# Patient Record
Sex: Female | Born: 1965 | Race: White | Hispanic: No | Marital: Single | Smoking: Never smoker
Health system: Southern US, Community
[De-identification: ages and names within clinical notes are randomized; demographics above are authoritative.]

## PROBLEM LIST (undated history)

## (undated) DIAGNOSIS — F32A Depression, unspecified: Secondary | ICD-10-CM

## (undated) DIAGNOSIS — M797 Fibromyalgia: Secondary | ICD-10-CM

## (undated) DIAGNOSIS — B379 Candidiasis, unspecified: Secondary | ICD-10-CM

## (undated) DIAGNOSIS — F338 Other recurrent depressive disorders: Secondary | ICD-10-CM

## (undated) DIAGNOSIS — A0472 Enterocolitis due to Clostridium difficile, not specified as recurrent: Secondary | ICD-10-CM

## (undated) DIAGNOSIS — J40 Bronchitis, not specified as acute or chronic: Secondary | ICD-10-CM

## (undated) DIAGNOSIS — F329 Major depressive disorder, single episode, unspecified: Secondary | ICD-10-CM

## (undated) DIAGNOSIS — R5382 Chronic fatigue, unspecified: Secondary | ICD-10-CM

## (undated) HISTORY — DX: Fibromyalgia: M79.7

## (undated) HISTORY — DX: Chronic fatigue, unspecified: R53.82

---

## 2007-01-16 ENCOUNTER — Emergency Department (HOSPITAL_COMMUNITY): Admission: EM | Admit: 2007-01-16 | Discharge: 2007-01-16 | Payer: Self-pay | Admitting: Emergency Medicine

## 2011-10-25 ENCOUNTER — Encounter (HOSPITAL_BASED_OUTPATIENT_CLINIC_OR_DEPARTMENT_OTHER): Payer: Self-pay | Admitting: *Deleted

## 2011-10-25 ENCOUNTER — Emergency Department (HOSPITAL_BASED_OUTPATIENT_CLINIC_OR_DEPARTMENT_OTHER)
Admission: EM | Admit: 2011-10-25 | Discharge: 2011-10-25 | Discharge status: Against Medical Advice | Payer: BC Managed Care – PPO | Attending: Emergency Medicine | Admitting: Emergency Medicine

## 2011-10-25 DIAGNOSIS — R109 Unspecified abdominal pain: Secondary | ICD-10-CM | POA: Insufficient documentation

## 2011-10-25 DIAGNOSIS — M25519 Pain in unspecified shoulder: Secondary | ICD-10-CM | POA: Insufficient documentation

## 2011-10-25 HISTORY — DX: Depression, unspecified: F32.A

## 2011-10-25 HISTORY — DX: Candidiasis, unspecified: B37.9

## 2011-10-25 HISTORY — DX: Bronchitis, not specified as acute or chronic: J40

## 2011-10-25 HISTORY — DX: Other recurrent depressive disorders: F33.8

## 2011-10-25 HISTORY — DX: Enterocolitis due to Clostridium difficile, not specified as recurrent: A04.72

## 2011-10-25 HISTORY — DX: Major depressive disorder, single episode, unspecified: F32.9

## 2011-10-25 NOTE — ED Notes (Signed)
Pt states she was dx'd with  Bronchitis 3 weeks ago and placed on an antibiotic. This led to a yeast infection. States she used Monistat 1 for that on Thursday and today developed severe abd and right shoulder pain. The Monistat label stated that if this happened to see a physician.

## 2013-09-24 ENCOUNTER — Emergency Department (HOSPITAL_BASED_OUTPATIENT_CLINIC_OR_DEPARTMENT_OTHER)
Admission: EM | Admit: 2013-09-24 | Discharge: 2013-09-24 | Disposition: A | Discharge status: Home / Self Care | Payer: BC Managed Care – PPO | Attending: Emergency Medicine | Admitting: Emergency Medicine

## 2013-09-24 ENCOUNTER — Emergency Department (HOSPITAL_BASED_OUTPATIENT_CLINIC_OR_DEPARTMENT_OTHER): Payer: BC Managed Care – PPO

## 2013-09-24 ENCOUNTER — Encounter (HOSPITAL_BASED_OUTPATIENT_CLINIC_OR_DEPARTMENT_OTHER): Payer: Self-pay | Admitting: Emergency Medicine

## 2013-09-24 DIAGNOSIS — IMO0002 Reserved for concepts with insufficient information to code with codable children: Secondary | ICD-10-CM | POA: Insufficient documentation

## 2013-09-24 DIAGNOSIS — Y9289 Other specified places as the place of occurrence of the external cause: Secondary | ICD-10-CM | POA: Insufficient documentation

## 2013-09-24 DIAGNOSIS — Z8619 Personal history of other infectious and parasitic diseases: Secondary | ICD-10-CM | POA: Insufficient documentation

## 2013-09-24 DIAGNOSIS — Z79899 Other long term (current) drug therapy: Secondary | ICD-10-CM | POA: Insufficient documentation

## 2013-09-24 DIAGNOSIS — F329 Major depressive disorder, single episode, unspecified: Secondary | ICD-10-CM | POA: Insufficient documentation

## 2013-09-24 DIAGNOSIS — J45909 Unspecified asthma, uncomplicated: Secondary | ICD-10-CM | POA: Insufficient documentation

## 2013-09-24 DIAGNOSIS — S93409A Sprain of unspecified ligament of unspecified ankle, initial encounter: Secondary | ICD-10-CM | POA: Insufficient documentation

## 2013-09-24 DIAGNOSIS — Y9389 Activity, other specified: Secondary | ICD-10-CM | POA: Insufficient documentation

## 2013-09-24 DIAGNOSIS — F3289 Other specified depressive episodes: Secondary | ICD-10-CM | POA: Insufficient documentation

## 2013-09-24 MED ORDER — HYDROCODONE-ACETAMINOPHEN 5-325 MG PO TABS
1.0000 | ORAL_TABLET | Freq: Once | ORAL | Status: AC
  Administered 2013-09-24: 1 via ORAL
  Filled 2013-09-24: qty 1

## 2013-09-24 MED ORDER — HYDROCODONE-ACETAMINOPHEN 5-325 MG PO TABS: 2.0000 | ORAL_TABLET | ORAL | Status: AC | PRN

## 2013-09-24 NOTE — Discharge Instructions (Signed)
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
## 2013-11-07 ENCOUNTER — Encounter (HOSPITAL_BASED_OUTPATIENT_CLINIC_OR_DEPARTMENT_OTHER): Payer: Self-pay | Admitting: Emergency Medicine

## 2013-11-07 ENCOUNTER — Emergency Department (HOSPITAL_BASED_OUTPATIENT_CLINIC_OR_DEPARTMENT_OTHER)
Admission: EM | Admit: 2013-11-07 | Discharge: 2013-11-07 | Disposition: A | Discharge status: Home / Self Care | Payer: BC Managed Care – PPO | Attending: Emergency Medicine | Admitting: Emergency Medicine

## 2013-11-07 ENCOUNTER — Emergency Department (HOSPITAL_BASED_OUTPATIENT_CLINIC_OR_DEPARTMENT_OTHER): Payer: BC Managed Care – PPO

## 2013-11-07 DIAGNOSIS — J45909 Unspecified asthma, uncomplicated: Secondary | ICD-10-CM | POA: Insufficient documentation

## 2013-11-07 DIAGNOSIS — F3289 Other specified depressive episodes: Secondary | ICD-10-CM | POA: Insufficient documentation

## 2013-11-07 DIAGNOSIS — Z79899 Other long term (current) drug therapy: Secondary | ICD-10-CM | POA: Insufficient documentation

## 2013-11-07 DIAGNOSIS — K7689 Other specified diseases of liver: Secondary | ICD-10-CM | POA: Insufficient documentation

## 2013-11-07 DIAGNOSIS — Z8619 Personal history of other infectious and parasitic diseases: Secondary | ICD-10-CM | POA: Insufficient documentation

## 2013-11-07 DIAGNOSIS — Z3202 Encounter for pregnancy test, result negative: Secondary | ICD-10-CM | POA: Insufficient documentation

## 2013-11-07 DIAGNOSIS — F329 Major depressive disorder, single episode, unspecified: Secondary | ICD-10-CM | POA: Insufficient documentation

## 2013-11-07 DIAGNOSIS — IMO0002 Reserved for concepts with insufficient information to code with codable children: Secondary | ICD-10-CM | POA: Insufficient documentation

## 2013-11-07 DIAGNOSIS — K5900 Constipation, unspecified: Secondary | ICD-10-CM

## 2013-11-07 LAB — CBC WITH DIFFERENTIAL/PLATELET
BASOS ABS: 0 10*3/uL (ref 0.0–0.1)
BASOS PCT: 0 % (ref 0–1)
EOS ABS: 0 10*3/uL (ref 0.0–0.7)
EOS PCT: 1 % (ref 0–5)
HEMATOCRIT: 35.3 % — AB (ref 36.0–46.0)
HEMOGLOBIN: 12.3 g/dL (ref 12.0–15.0)
Lymphocytes Relative: 24 % (ref 12–46)
Lymphs Abs: 1.2 10*3/uL (ref 0.7–4.0)
MCH: 32 pg (ref 26.0–34.0)
MCHC: 34.8 g/dL (ref 30.0–36.0)
MCV: 91.9 fL (ref 78.0–100.0)
MONO ABS: 0.5 10*3/uL (ref 0.1–1.0)
MONOS PCT: 11 % (ref 3–12)
Neutro Abs: 3.2 10*3/uL (ref 1.7–7.7)
Neutrophils Relative %: 64 % (ref 43–77)
Platelets: 195 10*3/uL (ref 150–400)
RBC: 3.84 MIL/uL — ABNORMAL LOW (ref 3.87–5.11)
RDW: 11.6 % (ref 11.5–15.5)
WBC: 5 10*3/uL (ref 4.0–10.5)

## 2013-11-07 LAB — COMPREHENSIVE METABOLIC PANEL
ALBUMIN: 4 g/dL (ref 3.5–5.2)
ALT: 19 U/L (ref 0–35)
AST: 23 U/L (ref 0–37)
Alkaline Phosphatase: 41 U/L (ref 39–117)
BILIRUBIN TOTAL: 1.6 mg/dL — AB (ref 0.3–1.2)
BUN: 9 mg/dL (ref 6–23)
CALCIUM: 9.4 mg/dL (ref 8.4–10.5)
CO2: 24 mEq/L (ref 19–32)
CREATININE: 0.7 mg/dL (ref 0.50–1.10)
Chloride: 105 mEq/L (ref 96–112)
GFR calc Af Amer: >90 mL/min (ref >90)
GFR calc non Af Amer: >90 mL/min (ref >90)
Glucose, Bld: 86 mg/dL (ref 70–99)
Potassium: 3.9 mEq/L (ref 3.7–5.3)
Sodium: 140 mEq/L (ref 137–147)
Total Protein: 6.8 g/dL (ref 6.0–8.3)

## 2013-11-07 LAB — URINALYSIS, ROUTINE W REFLEX MICROSCOPIC
BILIRUBIN URINE: NEGATIVE
GLUCOSE, UA: NEGATIVE mg/dL
KETONES UR: NEGATIVE mg/dL
Nitrite: NEGATIVE
PROTEIN: NEGATIVE mg/dL
Specific Gravity, Urine: 1.003 — ABNORMAL LOW (ref 1.005–1.030)
UROBILINOGEN UA: 0.2 mg/dL (ref 0.0–1.0)
pH: 7.5 (ref 5.0–8.0)

## 2013-11-07 LAB — URINE MICROSCOPIC-ADD ON

## 2013-11-07 LAB — PREGNANCY, URINE: Preg Test, Ur: NEGATIVE

## 2013-11-07 LAB — LIPASE, BLOOD: Lipase: 18 U/L (ref 11–59)

## 2013-11-07 MED ORDER — MORPHINE SULFATE 4 MG/ML IJ SOLN
4.0000 mg | Freq: Once | INTRAMUSCULAR | Status: AC
  Administered 2013-11-07: 4 mg via INTRAVENOUS
  Filled 2013-11-07: qty 1

## 2013-11-07 MED ORDER — IOHEXOL 300 MG/ML  SOLN
50.0000 mL | Freq: Once | INTRAMUSCULAR | Status: AC | PRN
  Administered 2013-11-07: 50 mL via ORAL

## 2013-11-07 MED ORDER — ONDANSETRON HCL 4 MG/2ML IJ SOLN
4.0000 mg | Freq: Once | INTRAMUSCULAR | Status: AC
  Administered 2013-11-07: 4 mg via INTRAVENOUS
  Filled 2013-11-07: qty 2

## 2013-11-07 MED ORDER — IOHEXOL 300 MG/ML  SOLN
100.0000 mL | Freq: Once | INTRAMUSCULAR | Status: AC | PRN
  Administered 2013-11-07: 100 mL via INTRAVENOUS

## 2013-11-07 NOTE — ED Provider Notes (Signed)
CSN: [id number]     Arrival date & time 11/07/13  1404 History   First MD Initiated Contact with Patient 11/07/13 1431     Chief Complaint  Patient presents with  . Abdominal Pain     (Consider location/radiation/quality/duration/timing/severity/associated sxs/prior Treatment) Patient is a 48 y.o. female presenting with abdominal pain. The history is provided by the patient. No language interpreter was used.  Abdominal Pain Pain location:  LLQ Pain quality: aching   Pain radiates to:  Does not radiate Pain severity:  Severe Onset quality:  Gradual Timing:  Constant Progression:  Unchanged Chronicity:  New Context: diet changes   Context: not recent illness   Relieved by:  Nothing Worsened by:  Nothing tried Ineffective treatments:  None tried Associated symptoms: no fever, no nausea and no vomiting     Past Medical History  Diagnosis Date  . Bronchitis   . Asthma   . Depression   . Yeast infection   . Seasonal affective disorder   . Pseudomembranous colitis    History reviewed. No pertinent past surgical history. No family history on file. History  Substance Use Topics  . Smoking status: Never Smoker   . Smokeless tobacco: Not on file  . Alcohol Use: No   OB History   Grav Para Term Preterm Abortions TAB SAB Ect Mult Living                 Review of Systems  Constitutional: Negative for fever.  Respiratory: Negative.   Cardiovascular: Negative.   Gastrointestinal: Positive for abdominal pain. Negative for nausea and vomiting.      Allergies  Butabarbital  Home Medications   Current Outpatient Rx  Name  Route  Sig  Dispense  Refill  . albuterol (PROVENTIL HFA;VENTOLIN HFA) 108 (90 BASE) MCG/ACT inhaler   Inhalation   Inhale into the lungs every 6 (six) hours as needed for wheezing or shortness of breath.         . fluticasone-salmeterol (ADVAIR HFA) 230-21 MCG/ACT inhaler   Inhalation   Inhale 1 puff into the lungs 2 (two) times daily.          Marland Kitchen. HYDROcodone-acetaminophen (NORCO/VICODIN) 5-325 MG per tablet   Oral   Take 2 tablets by mouth every 4 (four) hours as needed.   10 tablet   0   . sertraline (ZOLOFT) 100 MG tablet   Oral   Take 100 mg by mouth daily.          BP 102/64  Pulse 75  Temp(Src) 98 F (36.7 C) (Oral)  Resp 18  Ht 5\' 7"  (1.702 m)  Wt 140 lb (63.504 kg)  BMI 21.92 kg/m2  SpO2 100%  LMP 11/07/2013 Physical Exam  Nursing note and vitals reviewed. Constitutional: She is oriented to person, place, and time. She appears well-developed and well-nourished.  Cardiovascular: Normal rate and regular rhythm.   Pulmonary/Chest: Effort normal and breath sounds normal.  Abdominal: Soft. Bowel sounds are normal. There is tenderness in the left lower quadrant.  Musculoskeletal: Normal range of motion.  Neurological: She is alert and oriented to person, place, and time.  Skin: Skin is warm and dry.  Psychiatric: She has a normal mood and affect.    ED Course  Procedures (including critical care time) Labs Review Labs Reviewed  URINALYSIS, ROUTINE W REFLEX MICROSCOPIC - Abnormal; Notable for the following:    Specific Gravity, Urine 1.003 (*)    Hgb urine dipstick SMALL (*)    Leukocytes, UA  SMALL (*)    All other components within normal limits  CBC WITH DIFFERENTIAL - Abnormal; Notable for the following:    RBC 3.84 (*)    HCT 35.3 (*)    All other components within normal limits  COMPREHENSIVE METABOLIC PANEL - Abnormal; Notable for the following:    Total Bilirubin 1.6 (*)    All other components within normal limits  URINE CULTURE  PREGNANCY, URINE  LIPASE, BLOOD  URINE MICROSCOPIC-ADD ON   Imaging Review Ct Abdomen Pelvis W Contrast  11/07/2013   CLINICAL DATA:  Abdominal pain, left lower quadrant pain  EXAM: CT ABDOMEN AND PELVIS WITH CONTRAST  TECHNIQUE: Multidetector CT imaging of the abdomen and pelvis was performed using the standard protocol following bolus administration of  intravenous contrast.  CONTRAST:  50mL OMNIPAQUE IOHEXOL 300 MG/ML SOLN, 100mL OMNIPAQUE IOHEXOL 300 MG/ML SOLN  COMPARISON:  None.  FINDINGS: Lung bases clear. Normal heart size. No pericardial or pleural effusion. No hiatal hernia.  Left hepatic dome demonstrates a 10 mm hypodense cyst, image 10. Additional punctate subcentimeter scattered hepatic hypodensities, too small to definitively characterize but suspect additional small hepatic cysts. Incidental focal fatty infiltration of the liver along the falciform ligament, image 18. No biliary dilatation. No other definite focal hepatic abnormality. Patent portal vein. Gallbladder, biliary system, pancreas, spleen, adrenal glands, and kidneys are within normal limits for age and demonstrate no acute process.  Circumaortic left renal vein noted, a normal variant. Intact aorta. Negative for atherosclerosis or aneurysm.  No abdominal free fluid, fluid collection, hemorrhage, abscess, or adenopathy.  Negative for bowel obstruction, dilatation, ileus, or free air.  Moderate stool burden throughout the colon.  Pelvis: Trace pelvic free fluid, likely physiologic. Urinary bladder unremarkable. Uterus normal in size. No adnexal abnormality. No pelvic fluid collection, hemorrhage, abscess, adenopathy, inguinal abnormality, or hernia.  Minor degenerative changes and facet arthropathy of the spine diffusely. No compression fracture.  IMPRESSION: No acute intra-abdominal or pelvic finding.  Incidental hepatic cysts and focal fatty infiltration of the liver along the falciform ligament  Moderate stool burden throughout the colon, can be seen with constipation  Trace pelvic free fluid, likely physiologic   Electronically Signed   By: Ruel Favorsrevor  Shick M.D.   On: 11/07/2013 16:17     EKG Interpretation None      MDM   Final diagnoses:  Constipation  Liver cyst    Pt abdomen is benign. Pt has constipation and discussed treatment at home.     Teressa LowerVrinda Birtie Fellman,  NP 11/07/13 1636

## 2013-11-07 NOTE — ED Notes (Addendum)
Patient aware of needed urine sample states she went in the lobby.

## 2013-11-07 NOTE — ED Notes (Signed)
Abdominal pain. IBS. States she has not had problems for 20 years but gas and abdominal pain started last night.

## 2013-11-07 NOTE — Discharge Instructions (Signed)
Use magnesium citrate. Constipation, Adult Constipation is when a person has fewer than 3 bowel movements a week; has difficulty having a bowel movement; or has stools that are dry, hard, or larger than normal. As people grow older, constipation is more common. If you try to fix constipation with medicines that make you have a bowel movement (laxatives), the problem may get worse. Long-term laxative use may cause the muscles of the colon to become weak. A low-fiber diet, not taking in enough fluids, and taking certain medicines may make constipation worse. CAUSES   Certain medicines, such as antidepressants, pain medicine, iron supplements, antacids, and water pills.   Certain diseases, such as diabetes, irritable bowel syndrome (IBS), thyroid disease, or depression.   Not drinking enough water.   Not eating enough fiber-rich foods.   Stress or travel.  Lack of physical activity or exercise.  Not going to the restroom when there is the urge to have a bowel movement.  Ignoring the urge to have a bowel movement.  Using laxatives too much. SYMPTOMS   Having fewer than 3 bowel movements a week.   Straining to have a bowel movement.   Having hard, dry, or larger than normal stools.   Feeling full or bloated.   Pain in the lower abdomen.  Not feeling relief after having a bowel movement. DIAGNOSIS  Your caregiver will take a medical history and perform a physical exam. Further testing may be done for severe constipation. Some tests may include:   A barium enema X-ray to examine your rectum, colon, and sometimes, your small intestine.  A sigmoidoscopy to examine your lower colon.  A colonoscopy to examine your entire colon. TREATMENT  Treatment will depend on the severity of your constipation and what is causing it. Some dietary treatments include drinking more fluids and eating more fiber-rich foods. Lifestyle treatments may include regular exercise. If these diet and  lifestyle recommendations do not help, your caregiver may recommend taking over-the-counter laxative medicines to help you have bowel movements. Prescription medicines may be prescribed if over-the-counter medicines do not work.  HOME CARE INSTRUCTIONS   Increase dietary fiber in your diet, such as fruits, vegetables, whole grains, and beans. Limit high-fat and processed sugars in your diet, such as [place] fries, hamburgers, cookies, candies, and soda.   A fiber supplement may be added to your diet if you cannot get enough fiber from foods.   Drink enough fluids to keep your urine clear or pale yellow.   Exercise regularly or as directed by your caregiver.   Go to the restroom when you have the urge to go. Do not hold it.  Only take medicines as directed by your caregiver. Do not take other medicines for constipation without talking to your caregiver first. SEEK IMMEDIATE MEDICAL CARE IF:   You have bright red blood in your stool.   Your constipation lasts for more than 4 days or gets worse.   You have abdominal or rectal pain.   You have thin, pencil-like stools.  You have unexplained weight loss. MAKE SURE YOU:   Understand these instructions.  Will watch your condition.  Will get help right away if you are not doing well or get worse. Document Released: 04/11/2004 Document Revised: 10/06/2011 Document Reviewed: 04/25/2013 [facility] Patient Information 2014 [place], [place].

## 2013-11-09 LAB — URINE CULTURE: Colony Count: 40000

## 2013-11-09 NOTE — ED Provider Notes (Signed)
History/physical exam/procedure(s) were performed by non-physician practitioner and as supervising physician I was immediately available for consultation/collaboration. I have reviewed all notes and am in agreement with care and plan.   Hilario Quarryanielle S Jaziah Kwasnik, MD 11/09/13 1149

## 2015-07-09 IMAGING — CT CT ABD-PELV W/ CM
2 of 5 series · 16 of 46 positions shown, 18 images · IV contrast (APPLIED)
Comparison: None.

CLINICAL DATA: Abdominal pain, left lower quadrant pain

EXAM:
CT ABDOMEN AND PELVIS WITH CONTRAST
TECHNIQUE: Multidetector CT imaging of the abdomen and pelvis was performed
using the standard protocol following bolus administration of
intravenous contrast.
CONTRAST:  50mL OMNIPAQUE IOHEXOL 300 MG/ML SOLN, 100mL OMNIPAQUE
IOHEXOL 300 MG/ML SOLN

[Series 2: abd/pelvis 5.0 b31f · axial · 0.61mm/px · z∈[-625,-250]mm · 13 of 85 slices shown, 15 images]
[im 5/85  soft-tissue]
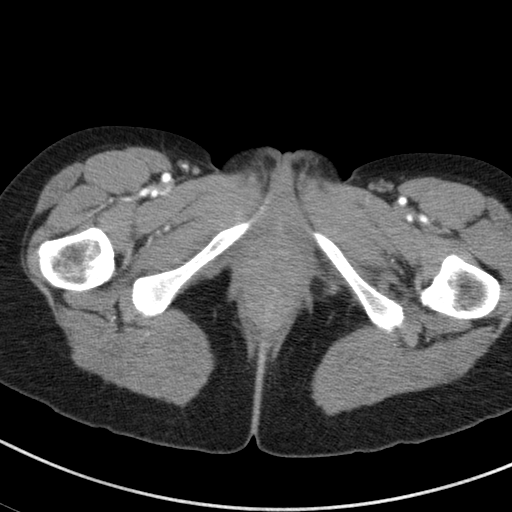
[im 5/85  bone]
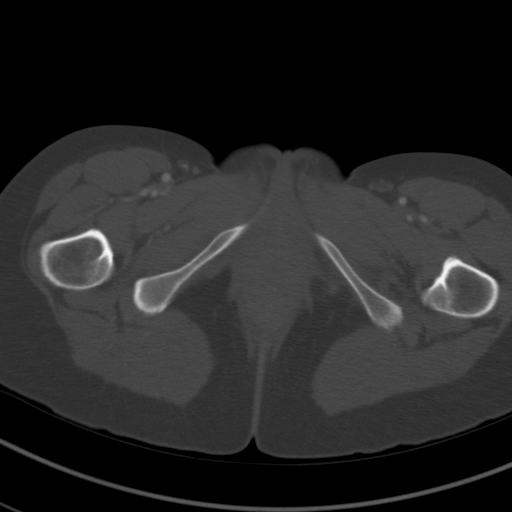
[im 14/85  soft-tissue]
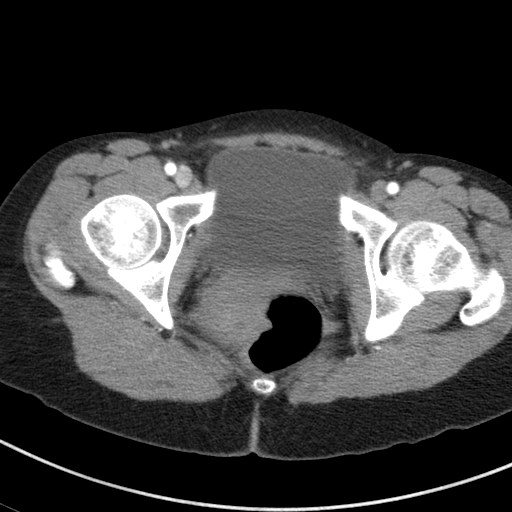
[im 18/85  soft-tissue]
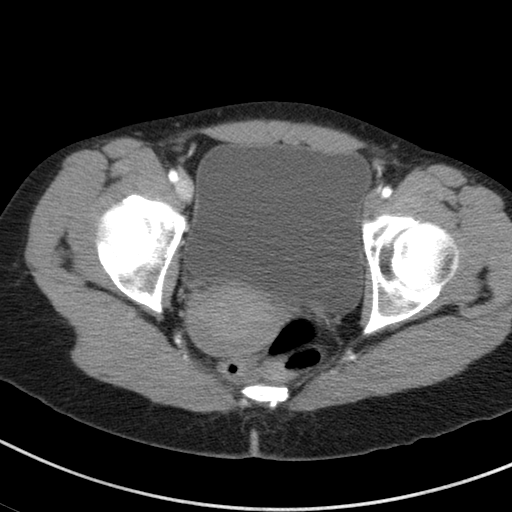
[im 23/85  soft-tissue]
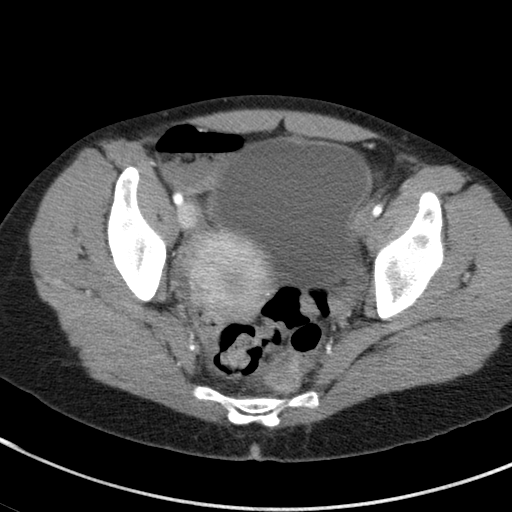
[im 31/85  soft-tissue]
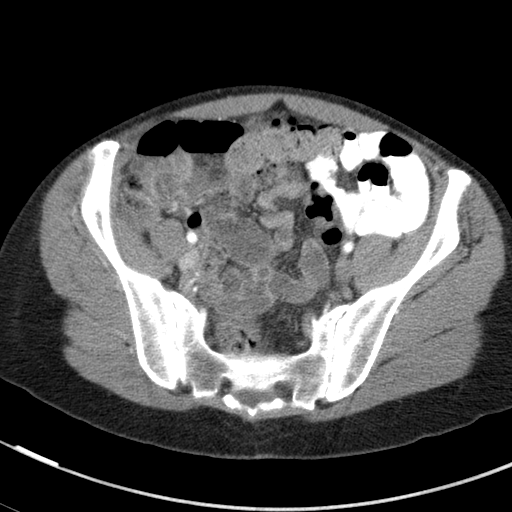
[im 36/85  soft-tissue]
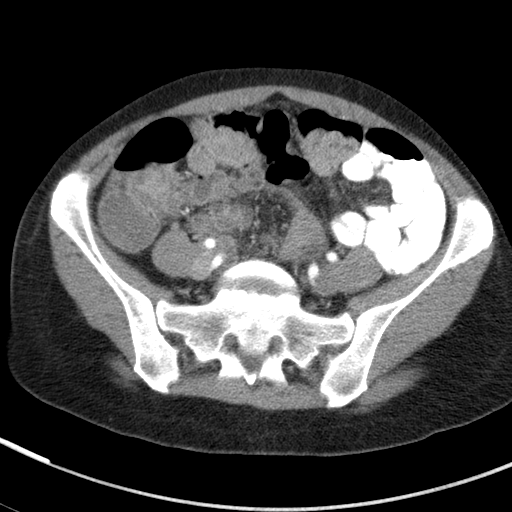
[im 45/85  soft-tissue]
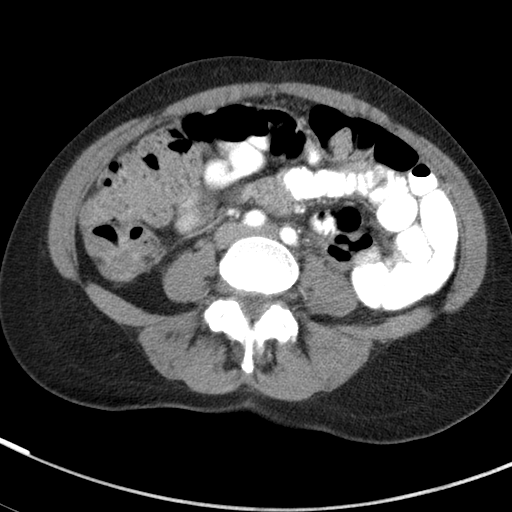
[im 49/85  soft-tissue]
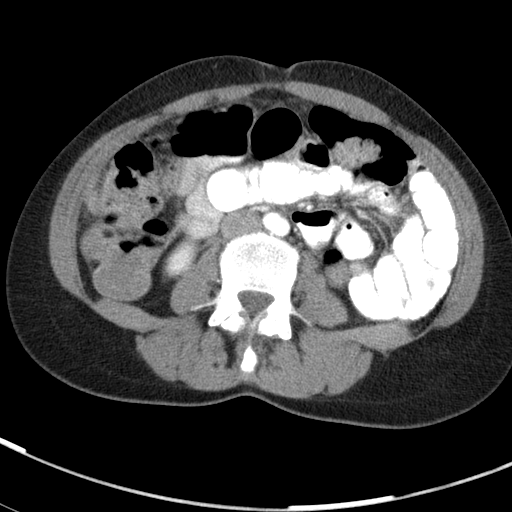
[im 54/85  soft-tissue]
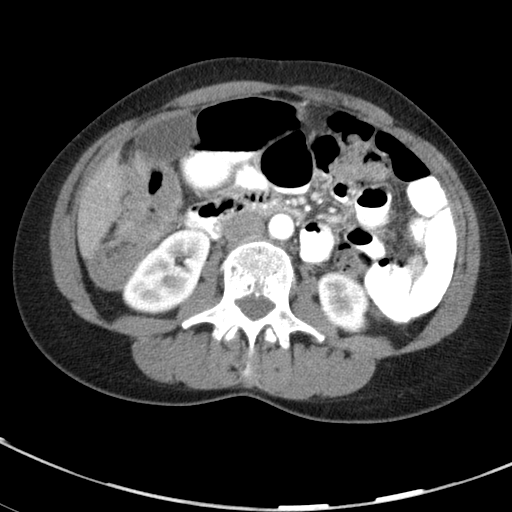
[im 54/85  bone]
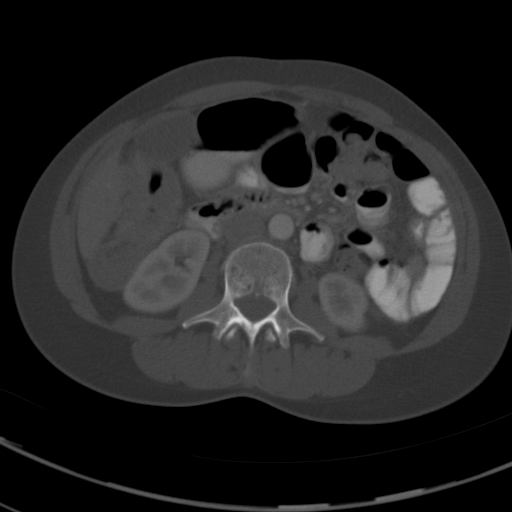
[im 62/85  soft-tissue]
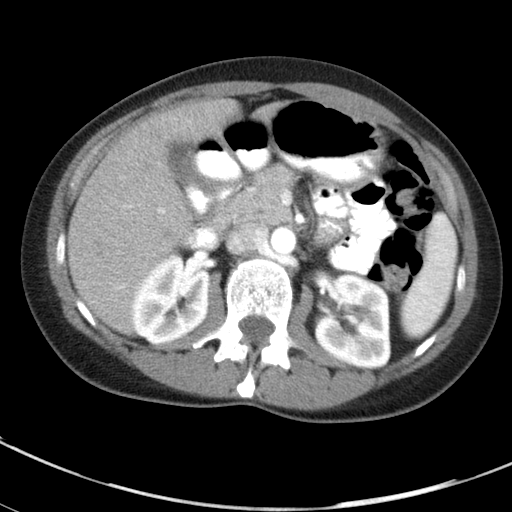
[im 67/85  soft-tissue]
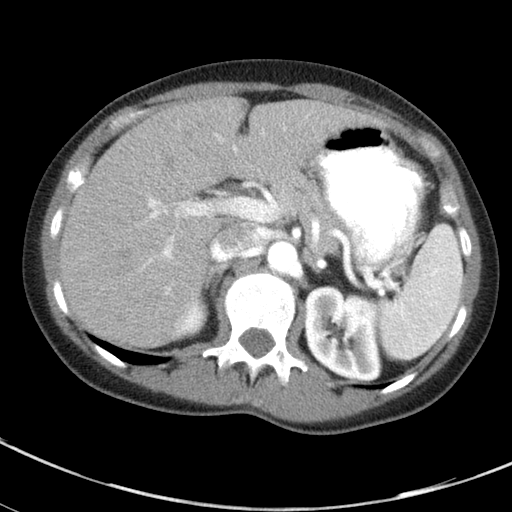
[im 71/85  soft-tissue]
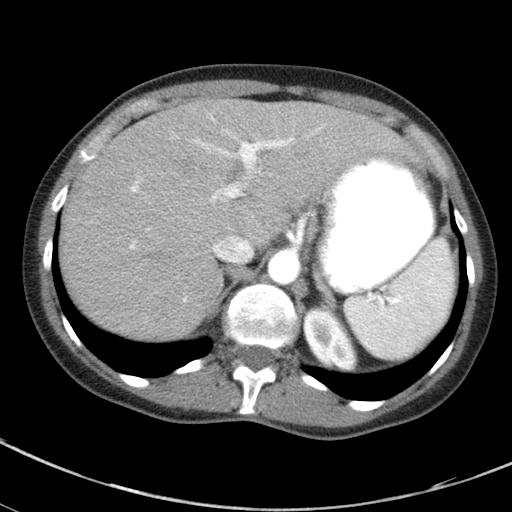
[im 80/85  soft-tissue]
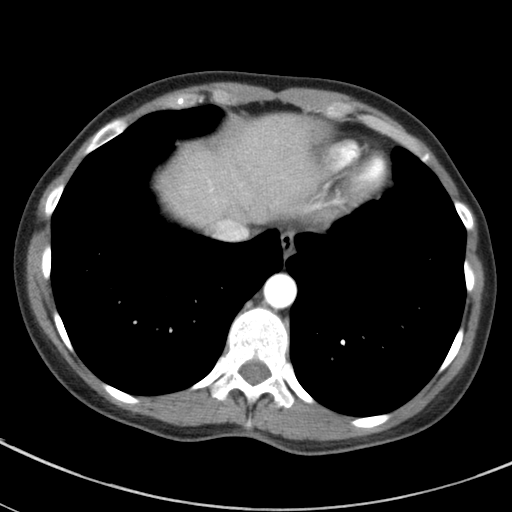

[Series 5: abd/pelvis 3.0 coronal · coronal · 0.67mm/px · 3 of 82 slices shown]
[im 28/82  soft-tissue]
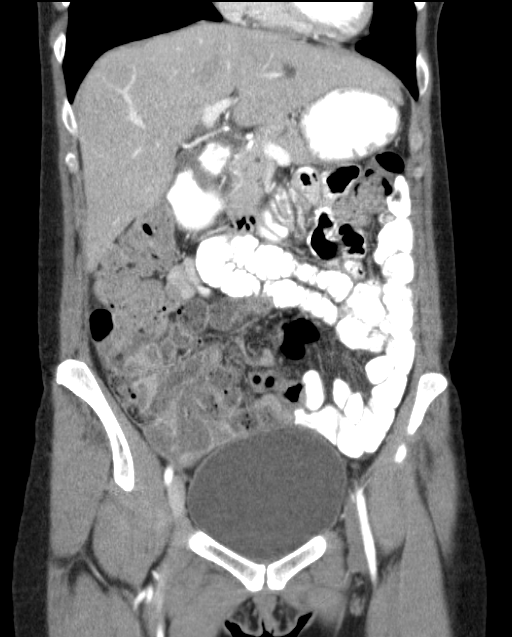
[im 37/82  soft-tissue]
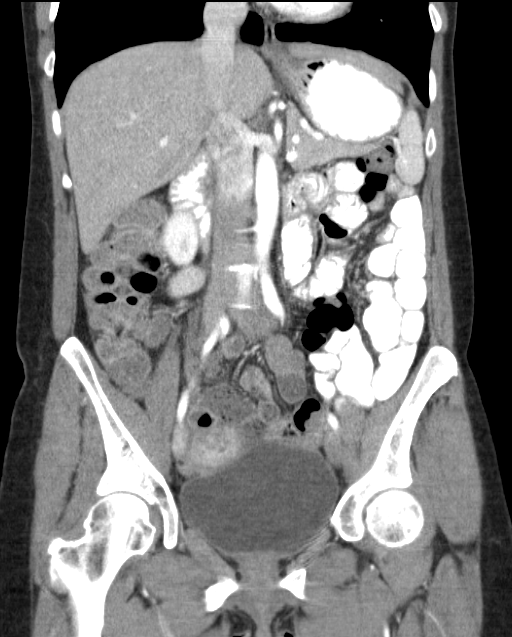
[im 46/82  soft-tissue]
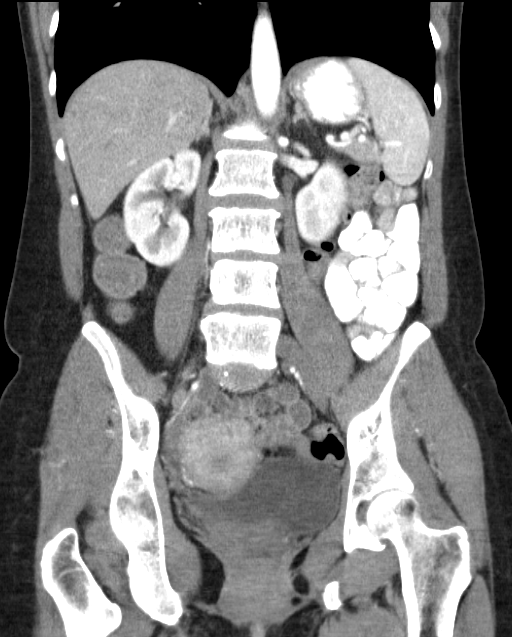

[16 of 46 positions shown; findings below may reference images not displayed]

FINDINGS: Lung bases clear. Normal heart size. No pericardial or pleural
effusion. No hiatal hernia.

Left hepatic dome demonstrates a 10 mm hypodense cyst, image 10.
Additional punctate subcentimeter scattered hepatic hypodensities,
too small to definitively characterize but suspect additional small
hepatic cysts. Incidental focal fatty infiltration of the liver
along the falciform ligament, image 18. No biliary dilatation. No
other definite focal hepatic abnormality. Patent portal vein.
Gallbladder, biliary system, pancreas, spleen, adrenal glands, and
kidneys are within normal limits for age and demonstrate no acute
process.

Circumaortic left renal vein noted, a normal variant. Intact aorta.
Negative for atherosclerosis or aneurysm.

No abdominal free fluid, fluid collection, hemorrhage, abscess, or
adenopathy.

Negative for bowel obstruction, dilatation, ileus, or free air.

Moderate stool burden throughout the colon.

Pelvis: Trace pelvic free fluid, likely physiologic. Urinary bladder
unremarkable. Uterus normal in size. No adnexal abnormality. No
pelvic fluid collection, hemorrhage, abscess, adenopathy, inguinal
abnormality, or hernia.

Minor degenerative changes and facet arthropathy of the spine
diffusely. No compression fracture.
IMPRESSION: No acute intra-abdominal or pelvic finding.

Incidental hepatic cysts and focal fatty infiltration of the liver
along the falciform ligament

Moderate stool burden throughout the colon, can be seen with
constipation

Trace pelvic free fluid, likely physiologic

## 2015-08-01 ENCOUNTER — Encounter: Payer: Self-pay | Admitting: Internal Medicine

## 2015-08-01 ENCOUNTER — Ambulatory Visit (INDEPENDENT_AMBULATORY_CARE_PROVIDER_SITE_OTHER): Payer: BC Managed Care – PPO | Admitting: Internal Medicine

## 2015-08-01 VITALS — BP 90/64 | HR 68 | Temp 98.3°F | Resp 16 | Ht 67.32 in | Wt 136.5 lb

## 2015-08-01 DIAGNOSIS — J4541 Moderate persistent asthma with (acute) exacerbation: Secondary | ICD-10-CM

## 2015-08-01 DIAGNOSIS — J301 Allergic rhinitis due to pollen: Secondary | ICD-10-CM

## 2015-08-01 DIAGNOSIS — J4540 Moderate persistent asthma, uncomplicated: Secondary | ICD-10-CM | POA: Insufficient documentation

## 2015-08-01 LAB — PULMONARY FUNCTION TEST

## 2015-08-01 MED ORDER — MOMETASONE FURO-FORMOTEROL FUM 200-5 MCG/ACT IN AERO: 2.0000 | INHALATION_SPRAY | Freq: Two times a day (BID) | RESPIRATORY_TRACT | Status: AC

## 2015-08-01 NOTE — Patient Instructions (Signed)
Moderate persistent asthma  Currently not well controlled due to likely viral syndrome Given Prednisone 10 mg tablets. Take 1 tablet twice a day for 4 days, then 1 tablet on day #5. Use Dulera 200 mg 2 puffs twice a day on a regular basis Use albuterol as needed   Allergic rhinitis due to pollen  Use nasal saline rinse twice a day while sick  Use cetirizine 10 mg daily as needed

## 2015-08-01 NOTE — Assessment & Plan Note (Signed)
   Currently not well controlled due to likely viral syndrome Given Prednisone 10 mg tablets. Take 1 tablet twice a day for 4 days, then 1 tablet on day #5. Use Dulera 200 mg 2 puffs twice a day on a regular basis Use albuterol as needed

## 2015-08-01 NOTE — Assessment & Plan Note (Signed)
   Use nasal saline rinse twice a day while sick  Use cetirizine 10 mg daily as needed

## 2015-08-01 NOTE — Progress Notes (Signed)
History of Present Illness: Sabrina Herrera is a 50 y.o. female presenting for a sick visit.  HPI Comments: Asthma: For the past week, she has had chest tightness, cough productive of yellow mucus, postnasal drainage. Using Mucinex twice a day without improvement in her symptoms. She uses Dulera on an as-needed basis  Allergic rhinitis: Skin testing in the past was positive for tree, weed, dust mite. Normally she has good symptom control.  Of note, patient was recently diagnosed with chronic fatigue syndrome   Assessment and Plan: Moderate persistent asthma  Currently not well controlled due to likely viral syndrome Given Prednisone 10 mg tablets. Take 1 tablet twice a day for 4 days, then 1 tablet on day #5. Use Dulera 200 mg 2 puffs twice a day on a regular basis Use albuterol as needed   Allergic rhinitis due to pollen  Use nasal saline rinse twice a day while sick  Use cetirizine 10 mg daily as needed    Return in about 4 weeks (around 08/29/2015).  Medications ordered this encounter:  Meds ordered this encounter  Medications  . mometasone-formoterol (DULERA) 200-5 MCG/ACT AERO    Sig: Inhale 1 puff into the lungs once as needed.   . Calcium Carbonate-Vitamin D (CALCIUM 500 + D) 500-125 MG-UNIT TABS    Sig: Take 1,200 mg by mouth.  . valACYclovir (VALTREX) 1000 MG tablet    Sig: once as needed.  . cephALEXin (KEFLEX) 500 MG capsule    Sig:   . Coenzyme Q10 (CO Q-10) 50 MG CAPS    Sig: Take by mouth.  . L-Glutathione CRYS    Sig: by Does not apply route.    Diagnostics: Spirometry: FEV1 3.13L or 100%, FEV1/FVC  82%.  This is a normal study.  Physical Exam: BP 90/64 mmHg  Pulse 68  Temp(Src) 98.3 F (36.8 C) (Oral)  Resp 16  Ht 5' 7.32" (1.71 m)  Wt 136 lb 7.4 oz (61.9 kg)  BMI 21.17 kg/m2   Physical Exam  Constitutional: She appears well-developed and well-nourished. No distress.  HENT:  Right Ear: External ear normal.  Left Ear: External ear normal.  Nose:  Nose normal.  Mouth/Throat: Oropharynx is clear and moist.  Eyes: Conjunctivae are normal. Right eye exhibits no discharge. Left eye exhibits no discharge.  Cardiovascular: Normal rate, regular rhythm and normal heart sounds.   No murmur heard. Pulmonary/Chest: Effort normal and breath sounds normal. No respiratory distress. She has no wheezes. She has no rales.  Frequent dry cough  Abdominal: Soft. Bowel sounds are normal.  Musculoskeletal: She exhibits no edema.  Lymphadenopathy:    She has no cervical adenopathy.  Neurological: She is alert.  Skin: No rash noted.  Vitals reviewed.   Medications: Current outpatient prescriptions:  .  albuterol (PROVENTIL HFA;VENTOLIN HFA) 108 (90 BASE) MCG/ACT inhaler, Inhale into the lungs every 6 (six) hours as needed for wheezing or shortness of breath., Disp: , Rfl:  .  Calcium Carbonate-Vitamin D (CALCIUM 500 + D) 500-125 MG-UNIT TABS, Take 1,200 mg by mouth., Disp: , Rfl:  .  cephALEXin (KEFLEX) 500 MG capsule, , Disp: , Rfl:  .  Coenzyme Q10 (CO Q-10) 50 MG CAPS, Take by mouth., Disp: , Rfl:  .  L-Glutathione CRYS, by Does not apply route., Disp: , Rfl:  .  mometasone-formoterol (DULERA) 200-5 MCG/ACT AERO, Inhale 1 puff into the lungs once as needed. , Disp: , Rfl:  .  valACYclovir (VALTREX) 1000 MG tablet, once as needed., Disp: , Rfl:  .  fluticasone-salmeterol (ADVAIR HFA) 230-21 MCG/ACT inhaler, Inhale 1 puff into the lungs 2 (two) times daily. Reported on 08/01/2015, Disp: , Rfl:  .  HYDROcodone-acetaminophen (NORCO/VICODIN) 5-325 MG per tablet, Take 2 tablets by mouth every 4 (four) hours as needed. (Patient not taking: Reported on 08/01/2015), Disp: 10 tablet, Rfl: 0 .  sertraline (ZOLOFT) 100 MG tablet, Take 100 mg by mouth daily. Reported on 08/01/2015, Disp: , Rfl:   Drug Allergies:  Allergies  Allergen Reactions  . Butabarbital   . Gluten Meal     protein  . Other Other (See Comments)    stomachache  . Chocolate Flavor Rash  .  Nickel Rash    itching    ROS: Per HPI unless specifically indicated below Review of Systems  Thank you for the opportunity to care for this patient.  Please do not hesitate to contact me with questions.

## 2015-08-10 ENCOUNTER — Emergency Department (HOSPITAL_BASED_OUTPATIENT_CLINIC_OR_DEPARTMENT_OTHER)
Admission: EM | Admit: 2015-08-10 | Discharge: 2015-08-10 | Disposition: A | Discharge status: Home / Self Care | Payer: BC Managed Care – PPO | Attending: Emergency Medicine | Admitting: Emergency Medicine

## 2015-08-10 ENCOUNTER — Encounter (HOSPITAL_BASED_OUTPATIENT_CLINIC_OR_DEPARTMENT_OTHER): Payer: Self-pay | Admitting: *Deleted

## 2015-08-10 DIAGNOSIS — Z3202 Encounter for pregnancy test, result negative: Secondary | ICD-10-CM | POA: Diagnosis not present

## 2015-08-10 DIAGNOSIS — Z8739 Personal history of other diseases of the musculoskeletal system and connective tissue: Secondary | ICD-10-CM | POA: Insufficient documentation

## 2015-08-10 DIAGNOSIS — R42 Dizziness and giddiness: Secondary | ICD-10-CM | POA: Insufficient documentation

## 2015-08-10 DIAGNOSIS — Z8659 Personal history of other mental and behavioral disorders: Secondary | ICD-10-CM | POA: Insufficient documentation

## 2015-08-10 DIAGNOSIS — Z7951 Long term (current) use of inhaled steroids: Secondary | ICD-10-CM | POA: Diagnosis not present

## 2015-08-10 DIAGNOSIS — Z8709 Personal history of other diseases of the respiratory system: Secondary | ICD-10-CM | POA: Diagnosis not present

## 2015-08-10 DIAGNOSIS — Z79899 Other long term (current) drug therapy: Secondary | ICD-10-CM | POA: Insufficient documentation

## 2015-08-10 DIAGNOSIS — Z8619 Personal history of other infectious and parasitic diseases: Secondary | ICD-10-CM | POA: Diagnosis not present

## 2015-08-10 DIAGNOSIS — R5383 Other fatigue: Secondary | ICD-10-CM | POA: Insufficient documentation

## 2015-08-10 LAB — COMPREHENSIVE METABOLIC PANEL
ALK PHOS: 53 U/L (ref 38–126)
ALT: 22 U/L (ref 14–54)
ANION GAP: 5 (ref 5–15)
AST: 21 U/L (ref 15–41)
Albumin: 3.5 g/dL (ref 3.5–5.0)
BUN: 13 mg/dL (ref 6–20)
CO2: 29 mmol/L (ref 22–32)
CREATININE: 0.94 mg/dL (ref 0.44–1.00)
Calcium: 8.4 mg/dL — ABNORMAL LOW (ref 8.9–10.3)
Chloride: 104 mmol/L (ref 101–111)
GFR calc Af Amer: >60 mL/min (ref >60)
GFR calc non Af Amer: >60 mL/min (ref >60)
GLUCOSE: 91 mg/dL (ref 65–99)
Potassium: 4 mmol/L (ref 3.5–5.1)
SODIUM: 138 mmol/L (ref 135–145)
TOTAL PROTEIN: 6.1 g/dL — AB (ref 6.5–8.1)
Total Bilirubin: 0.8 mg/dL (ref 0.3–1.2)

## 2015-08-10 LAB — CBC WITH DIFFERENTIAL/PLATELET
BASOS PCT: 1 %
Basophils Absolute: 0 10*3/uL (ref 0.0–0.1)
Eosinophils Absolute: 0.2 10*3/uL (ref 0.0–0.7)
Eosinophils Relative: 4 %
HEMATOCRIT: 33 % — AB (ref 36.0–46.0)
Hemoglobin: 11.2 g/dL — ABNORMAL LOW (ref 12.0–15.0)
Lymphocytes Relative: 42 %
Lymphs Abs: 1.9 10*3/uL (ref 0.7–4.0)
MCH: 30.4 pg (ref 26.0–34.0)
MCHC: 33.9 g/dL (ref 30.0–36.0)
MCV: 89.7 fL (ref 78.0–100.0)
MONOS PCT: 11 %
Monocytes Absolute: 0.5 10*3/uL (ref 0.1–1.0)
NEUTROS ABS: 1.8 10*3/uL (ref 1.7–7.7)
Neutrophils Relative %: 42 %
Platelets: 273 10*3/uL (ref 150–400)
RBC: 3.68 MIL/uL — AB (ref 3.87–5.11)
RDW: 11.9 % (ref 11.5–15.5)
WBC: 4.4 10*3/uL (ref 4.0–10.5)

## 2015-08-10 LAB — URINALYSIS, ROUTINE W REFLEX MICROSCOPIC
BILIRUBIN URINE: NEGATIVE
GLUCOSE, UA: NEGATIVE mg/dL
Hgb urine dipstick: NEGATIVE
Ketones, ur: NEGATIVE mg/dL
Leukocytes, UA: NEGATIVE
Nitrite: NEGATIVE
PH: 7.5 (ref 5.0–8.0)
PROTEIN: NEGATIVE mg/dL
Specific Gravity, Urine: 1.005 (ref 1.005–1.030)

## 2015-08-10 LAB — PREGNANCY, URINE: Preg Test, Ur: NEGATIVE

## 2015-08-10 LAB — TSH: TSH: 1.399 u[IU]/mL (ref 0.350–4.500)

## 2015-08-10 LAB — CBG MONITORING, ED: Glucose-Capillary: 100 mg/dL — ABNORMAL HIGH (ref 65–99)

## 2015-08-10 MED ORDER — SODIUM CHLORIDE 0.9 % IV BOLUS (SEPSIS)
1000.0000 mL | Freq: Once | INTRAVENOUS | Status: AC
  Administered 2015-08-10: 1000 mL via INTRAVENOUS

## 2015-08-10 NOTE — ED Notes (Addendum)
States she has been fatigue and lightheadedness since yesterday. Hx of same but has never seen a doctor about the symptoms. Hx of chronic fatigue syndrome from old hx in Epic. She states she did not want to come but family insisted she come to the ED. She denies SI or depression.

## 2015-08-10 NOTE — ED Notes (Signed)
Patient stable and ambulatory. Patient verbalizes understanding of discharge instructions and follow-up. 

## 2015-08-10 NOTE — ED Notes (Signed)
Patient ambulated fine on her own, she stated felt much better.

## 2015-08-10 NOTE — ED Provider Notes (Signed)
CSN: [id number]     Arrival date & time 08/10/15  1602 History   First MD Initiated Contact with Patient 08/10/15 1643     Chief Complaint  Patient presents with  . Fatigue     (Consider location/radiation/quality/duration/timing/severity/associated sxs/prior Treatment) HPI Comments: 50 year old female with history of chronic fatigue, fibromyalgia presents for fatigue and low blood pressure. The patient reports that over the last few days she has been feeling extremely tired more so than usual. She has wanted to sleep all the time. Today she reports that she used her dad's blood pressure cuff to take her blood pressure and found it to be in the 70s systolic. She said that her family made her come to the hospital for evaluation. She denies fevers, chills, nausea, vomiting. No headache. She has not passed out although she has noted feeling somewhat lightheaded when she stands up. No focal weakness or sensory deficits. Reports normal bowel bladder function.   Past Medical History  Diagnosis Date  . Bronchitis   . Depression   . Yeast infection   . Seasonal affective disorder (HCC)   . Pseudomembranous colitis   . Chronic fatigue   . Fibromyalgia    History reviewed. No pertinent past surgical history. No family history on file. Social History  Substance Use Topics  . Smoking status: Never Smoker   . Smokeless tobacco: None  . Alcohol Use: No   OB History    No data available     Review of Systems  Constitutional: Positive for fatigue. Negative for fever, chills, activity change and appetite change.  HENT: Negative for congestion, postnasal drip, rhinorrhea and sinus pressure.   Eyes: Negative for pain and visual disturbance.  Cardiovascular: Negative for chest pain and palpitations.  Gastrointestinal: Negative for nausea, vomiting, abdominal pain and diarrhea.  Genitourinary: Negative for dysuria, urgency, hematuria and flank pain.  Musculoskeletal: Negative for myalgias and  back pain.  Skin: Negative for rash.  Neurological: Positive for light-headedness. Negative for dizziness, syncope, weakness, numbness and headaches.  Hematological: Does not bruise/bleed easily.      Allergies  Butabarbital; Gluten meal; Other; Chocolate flavor; and Nickel  Home Medications   Prior to Admission medications   Medication Sig Start Date End Date Taking? Authorizing Provider  albuterol (PROVENTIL HFA;VENTOLIN HFA) 108 (90 BASE) MCG/ACT inhaler Inhale into the lungs every 6 (six) hours as needed for wheezing or shortness of breath.    Historical Provider, MD  Calcium Carbonate-Vitamin D (CALCIUM 500 + D) 500-125 MG-UNIT TABS Take 1,200 mg by mouth.    Historical Provider, MD  cephALEXin (KEFLEX) 500 MG capsule  07/31/15   Historical Provider, MD  Coenzyme Q10 (CO Q-10) 50 MG CAPS Take by mouth.    Historical Provider, MD  fluticasone-salmeterol (ADVAIR HFA) 230-21 MCG/ACT inhaler Inhale 1 puff into the lungs 2 (two) times daily. Reported on 08/01/2015    Historical Provider, MD  HYDROcodone-acetaminophen (NORCO/VICODIN) 5-325 MG per tablet Take 2 tablets by mouth every 4 (four) hours as needed. Patient not taking: Reported on 08/01/2015 09/24/13   Rolland Porter, MD  L-Glutathione CRYS by Does not apply route.    Historical Provider, MD  mometasone-formoterol (DULERA) 200-5 MCG/ACT AERO Inhale 2 puffs into the lungs 2 (two) times daily. 08/01/15   Mikki Santee, MD  sertraline (ZOLOFT) 100 MG tablet Take 100 mg by mouth daily. Reported on 08/01/2015    Historical Provider, MD  valACYclovir (VALTREX) 1000 MG tablet once as needed. 11/11/13   Historical Provider, MD  BP 94/47 mmHg  Pulse 62  Temp(Src) 97.9 F (36.6 C) (Oral)  Resp 18  Ht 5\' 7"  (1.702 m)  Wt 136 lb (61.689 kg)  BMI 21.30 kg/m2  SpO2 97% Physical Exam  Constitutional: She is oriented to person, place, and time. She appears well-developed and well-nourished. No distress.  HENT:  Head: Normocephalic and atraumatic.   Right Ear: External ear normal.  Left Ear: External ear normal.  Nose: Nose normal.  Mouth/Throat: Oropharynx is clear and moist. No oropharyngeal exudate.  Eyes: EOM are normal. Pupils are equal, round, and reactive to light.  Neck: Normal range of motion. Neck supple.  Cardiovascular: Normal rate, regular rhythm, normal heart sounds and intact distal pulses.   No murmur heard. Pulmonary/Chest: Effort normal. No respiratory distress. She has no wheezes. She has no rales.  Abdominal: Soft. She exhibits no distension. There is no tenderness.  Musculoskeletal: Normal range of motion. She exhibits no edema or tenderness.  Neurological: She is alert and oriented to person, place, and time.  Skin: Skin is warm and dry. No rash noted. She is not diaphoretic.  Vitals reviewed.   ED Course  Procedures (including critical care time) Labs Review Labs Reviewed  CBC WITH DIFFERENTIAL/PLATELET - Abnormal; Notable for the following:    RBC 3.68 (*)    Hemoglobin 11.2 (*)    HCT 33.0 (*)    All other components within normal limits  COMPREHENSIVE METABOLIC PANEL - Abnormal; Notable for the following:    Calcium 8.4 (*)    Total Protein 6.1 (*)    All other components within normal limits  CBG MONITORING, ED - Abnormal; Notable for the following:    Glucose-Capillary 100 (*)    All other components within normal limits  URINALYSIS, ROUTINE W REFLEX MICROSCOPIC (NOT AT [facility])  PREGNANCY, URINE  TSH    Imaging Review No results found. I have personally reviewed and evaluated these images and lab results as part of my medical decision-making.   EKG Interpretation   Date/Time:  Friday August 10 2015 17:15:25 EST Ventricular Rate:  47 PR Interval:  159 QRS Duration: 85 QT Interval:  439 QTC Calculation: 388 R Axis:   87 Text Interpretation:  Age not entered, assumed to be  50 years old for  purpose of ECG interpretation Sinus bradycardia No previous ECGs available  Confirmed by  Marek Nghiem ([id number]) on 08/10/2015 6:22:05 PM      MDM  Patient was seen and evaluated in stable condition. Patient was given IV fluids. Laboratory studies were unremarkable. EKG unremarkable. No hypotension in the emergency department. Patient was not orthostatic. She was able to ambulate without difficulty. She said she felt much better after IV fluid bolus. Cause of episode today is unclear. Check inpatient to keep herself well hydrated and follow-up with her primary care physician. She expressed understanding and agreement with plan of care. Strict return precautions were given. Final diagnoses:  Other fatigue    1. Fatigue    Leta BaptistEmily Roe Lisabeth Mian, MD 08/11/15 [phone]

## 2015-08-10 NOTE — Discharge Instructions (Signed)
You were seen today for your fatigue and low blood pressure at home. The exact cause of this is unclear. This improved with IV fluids. Try to keep herself well-hydrated. Make a follow-up appointment with her primary care physician for reevaluation. Return with new concerning symptoms.  Orthostatic Hypotension Orthostatic hypotension is a sudden drop in blood pressure. It happens when you quickly stand up from a seated or lying position. You may feel dizzy or light-headed. This can last for just a few seconds or for up to a few minutes. It is usually not a serious problem. However, if this happens frequently or gets worse, it can be a sign of something more serious. CAUSES  Different things can cause orthostatic hypotension, including:   Loss of body fluids (dehydration).  Medicines that lower blood pressure.  Sudden changes in posture, such as standing up quickly after you have been sitting or lying down.  Taking too much of your medicine. SIGNS AND SYMPTOMS   Light-headedness or dizziness.   Fainting or near-fainting.   A fast heart rate.   Weakness.   Feeling tired (fatigue).  DIAGNOSIS  Your health care provider may do several things to help diagnose your condition and identify the cause. These may include:   Taking a medical history and doing a physical exam.  Checking your blood pressure. Your health care provider will check your blood pressure when you are:  Lying down.  Sitting.  Standing.  Using tilt table testing. In this test, you lie down on a table that moves from a lying position to a standing position. You will be strapped onto the table. This test monitors your blood pressure and heart rate when you are in different positions. TREATMENT  Treatment will vary depending on the cause. Possible treatments include:   Changing the dosage of your medicines.  Wearing compression stockings on your lower legs.  Standing up slowly after sitting or lying  down.  Eating more salt.  Eating frequent, small meals.  In some cases, getting IV fluids.  Taking medicine to enhance fluid retention. HOME CARE INSTRUCTIONS  Only take over-the-counter or prescription medicines as directed by your health care provider.  Follow your health care provider's instructions for changing the dosage of your current medicines.  Do not stop or adjust your medicine on your own.  Stand up slowly after sitting or lying down. This allows your body to adjust to the different position.  Wear compression stockings as directed.  Eat extra salt as directed.  Do not add extra salt to your diet unless directed to by your health care provider.  Eat frequent, small meals.  Avoid standing suddenly after eating.  Avoid hot showers or excessive heat as directed by your health care provider.  Keep all follow-up appointments. SEEK MEDICAL CARE IF:  You continue to feel dizzy or light-headed after standing.  You feel groggy or confused.  You feel cold, clammy, or sick to your stomach (nauseous).  You have blurred vision.  You feel short of breath. SEEK IMMEDIATE MEDICAL CARE IF:   You faint after standing.  You have chest pain.  You have difficulty breathing.   You lose feeling or movement in your arms or legs.   You have slurred speech or difficulty talking, or you are unable to talk.  MAKE SURE YOU:   Understand these instructions.  Will watch your condition.  Will get help right away if you are not doing well or get worse.   This information is  not intended to replace advice given to you by your health care provider. Make sure you discuss any questions you have with your health care provider.   Document Released: 07/04/2002 Document Revised: 07/19/2013 Document Reviewed: 05/06/2013 Elsevier Interactive Patient Education 2016 [employer]   Fatigue Fatigue is feeling tired all of the time, a lack of energy, or a lack of motivation.  Occasional or mild fatigue is often a normal response to activity or life in general. However, long-lasting (chronic) or extreme fatigue may indicate an underlying medical condition. HOME CARE INSTRUCTIONS  Watch your fatigue for any changes. The following actions may help to lessen any discomfort you are feeling:  Talk to your health care provider about how much sleep you need each night. Try to get the required amount every night.  Take medicines only as directed by your health care provider.  Eat a healthy and nutritious diet. Ask your health care provider if you need help changing your diet.  Drink enough fluid to keep your urine clear or pale yellow.  Practice ways of relaxing, such as yoga, meditation, massage therapy, or acupuncture.  Exercise regularly.   Change situations that cause you stress. Try to keep your work and personal routine reasonable.  Do not abuse illegal drugs.  Limit alcohol intake to no more than 1 drink per day for nonpregnant women and 2 drinks per day for men. One drink equals 12 ounces of beer, 5 ounces of wine, or 1 ounces of hard liquor.  Take a multivitamin, if directed by your health care provider. SEEK MEDICAL CARE IF:   Your fatigue does not get better.  You have a fever.   You have unintentional weight loss or gain.  You have headaches.   You have difficulty:   Falling asleep.  Sleeping throughout the night.  You feel angry, guilty, anxious, or sad.   You are unable to have a bowel movement (constipation).   You skin is dry.   Your legs or another part of your body is swollen.  SEEK IMMEDIATE MEDICAL CARE IF:   You feel confused.   Your vision is blurry.  You feel faint or pass out.   You have a severe headache.   You have severe abdominal, pelvic, or back pain.   You have chest pain, shortness of breath, or an irregular or fast heartbeat.   You are unable to urinate or you urinate less than normal.    You develop abnormal bleeding, such as bleeding from the rectum, vagina, nose, lungs, or nipples.  You vomit blood.   You have thoughts about harming yourself or committing suicide.   You are worried that you might harm someone else.    This information is not intended to replace advice given to you by your health care provider. Make sure you discuss any questions you have with your health care provider.   Document Released: 05/11/2007 Document Revised: 08/04/2014 Document Reviewed: 11/15/2013 Elsevier Interactive Patient Education [employer]

## 2015-08-22 ENCOUNTER — Other Ambulatory Visit: Payer: Self-pay | Admitting: Allergy

## 2015-08-22 MED ORDER — FLUCONAZOLE 150 MG PO TABS: 150.0000 mg | ORAL_TABLET | Freq: Every day | ORAL | Status: AC

## 2015-09-06 ENCOUNTER — Other Ambulatory Visit: Payer: Self-pay | Admitting: Pediatrics
